# Patient Record
Sex: Male | Born: 1990 | Race: Black or African American | Hispanic: No | Marital: Single | Smoking: Never smoker
Health system: Southern US, Community
[De-identification: ages and names within clinical notes are randomized; demographics above are authoritative.]

---

## 2019-02-11 ENCOUNTER — Emergency Department (HOSPITAL_COMMUNITY)
Admission: EM | Admit: 2019-02-11 | Discharge: 2019-02-11 | Disposition: A | Discharge status: Home / Self Care | Payer: BC Managed Care – PPO | Attending: Emergency Medicine | Admitting: Emergency Medicine

## 2019-02-11 ENCOUNTER — Emergency Department (HOSPITAL_COMMUNITY): Payer: BC Managed Care – PPO

## 2019-02-11 ENCOUNTER — Other Ambulatory Visit: Payer: Self-pay

## 2019-02-11 ENCOUNTER — Encounter (HOSPITAL_COMMUNITY): Payer: Self-pay | Admitting: Emergency Medicine

## 2019-02-11 DIAGNOSIS — Y939 Activity, unspecified: Secondary | ICD-10-CM | POA: Insufficient documentation

## 2019-02-11 DIAGNOSIS — S199XXA Unspecified injury of neck, initial encounter: Secondary | ICD-10-CM | POA: Diagnosis present

## 2019-02-11 DIAGNOSIS — Y999 Unspecified external cause status: Secondary | ICD-10-CM | POA: Diagnosis not present

## 2019-02-11 DIAGNOSIS — S0990XA Unspecified injury of head, initial encounter: Secondary | ICD-10-CM | POA: Insufficient documentation

## 2019-02-11 DIAGNOSIS — Y92410 Unspecified street and highway as the place of occurrence of the external cause: Secondary | ICD-10-CM | POA: Insufficient documentation

## 2019-02-11 DIAGNOSIS — S161XXA Strain of muscle, fascia and tendon at neck level, initial encounter: Secondary | ICD-10-CM | POA: Insufficient documentation

## 2019-02-11 MED ORDER — METHOCARBAMOL 500 MG PO TABS: 500.0000 mg | ORAL_TABLET | Freq: Two times a day (BID) | ORAL | 0 refills | Status: AC

## 2019-02-11 MED ORDER — HYDROCODONE-ACETAMINOPHEN 5-325 MG PO TABS
1.0000 | ORAL_TABLET | Freq: Once | ORAL | Status: DC
  Filled 2019-02-11: qty 1
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
## 2019-02-11 NOTE — ED Notes (Signed)
Patient transported to CT and Xray. Family at bedside.

## 2019-02-11 NOTE — Discharge Instructions (Signed)
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
